# Patient Record
Sex: Female | Born: 1975 | Race: Black or African American | Hispanic: No | Marital: Single | Smoking: Never smoker
Health system: Southern US, Community
[De-identification: ages and names within clinical notes are randomized; demographics above are authoritative.]

---

## 2017-02-17 ENCOUNTER — Emergency Department (HOSPITAL_COMMUNITY): Payer: No Typology Code available for payment source

## 2017-02-17 ENCOUNTER — Encounter (HOSPITAL_COMMUNITY): Payer: Self-pay

## 2017-02-17 ENCOUNTER — Other Ambulatory Visit: Payer: Self-pay

## 2017-02-17 ENCOUNTER — Emergency Department (HOSPITAL_COMMUNITY)
Admission: EM | Admit: 2017-02-17 | Discharge: 2017-02-17 | Disposition: A | Discharge status: Home / Self Care | Payer: No Typology Code available for payment source | Attending: Emergency Medicine | Admitting: Emergency Medicine

## 2017-02-17 DIAGNOSIS — R109 Unspecified abdominal pain: Secondary | ICD-10-CM

## 2017-02-17 DIAGNOSIS — R1010 Upper abdominal pain, unspecified: Secondary | ICD-10-CM | POA: Diagnosis not present

## 2017-02-17 LAB — URINALYSIS, ROUTINE W REFLEX MICROSCOPIC
Bilirubin Urine: NEGATIVE
Glucose, UA: NEGATIVE mg/dL
Ketones, ur: NEGATIVE mg/dL
Leukocytes, UA: NEGATIVE
Nitrite: NEGATIVE
Protein, ur: NEGATIVE mg/dL
Specific Gravity, Urine: >1.046 — ABNORMAL HIGH (ref 1.005–1.030)
pH: 5 (ref 5.0–8.0)

## 2017-02-17 LAB — CBC
HCT: 40.6 % (ref 36.0–46.0)
Hemoglobin: 13.3 g/dL (ref 12.0–15.0)
MCH: 27.4 pg (ref 26.0–34.0)
MCHC: 32.8 g/dL (ref 30.0–36.0)
MCV: 83.7 fL (ref 78.0–100.0)
Platelets: 266 10*3/uL (ref 150–400)
RBC: 4.85 MIL/uL (ref 3.87–5.11)
RDW: 15 % (ref 11.5–15.5)
WBC: 6 10*3/uL (ref 4.0–10.5)

## 2017-02-17 LAB — COMPREHENSIVE METABOLIC PANEL
ALT: 16 U/L (ref 14–54)
AST: 14 U/L — ABNORMAL LOW (ref 15–41)
Albumin: 3.7 g/dL (ref 3.5–5.0)
Alkaline Phosphatase: 52 U/L (ref 38–126)
Anion gap: 6 (ref 5–15)
BUN: 13 mg/dL (ref 6–20)
CO2: 25 mmol/L (ref 22–32)
Calcium: 9.5 mg/dL (ref 8.9–10.3)
Chloride: 107 mmol/L (ref 101–111)
Creatinine, Ser: 0.74 mg/dL (ref 0.44–1.00)
GFR calc Af Amer: >60 mL/min (ref >60)
GFR calc non Af Amer: >60 mL/min (ref >60)
Glucose, Bld: 97 mg/dL (ref 65–99)
Potassium: 4.5 mmol/L (ref 3.5–5.1)
Sodium: 138 mmol/L (ref 135–145)
Total Bilirubin: 0.9 mg/dL (ref 0.3–1.2)
Total Protein: 7.3 g/dL (ref 6.5–8.1)

## 2017-02-17 LAB — I-STAT BETA HCG BLOOD, ED (MC, WL, AP ONLY): I-stat hCG, quantitative: <5 m[IU]/mL (ref <5)

## 2017-02-17 LAB — LIPASE, BLOOD: Lipase: 24 U/L (ref 11–51)

## 2017-02-17 MED ORDER — DICYCLOMINE HCL 10 MG PO CAPS
10.0000 mg | ORAL_CAPSULE | Freq: Once | ORAL | Status: AC
  Administered 2017-02-17: 10 mg via ORAL
  Filled 2017-02-17: qty 1

## 2017-02-17 MED ORDER — SODIUM CHLORIDE 0.9 % IJ SOLN
INTRAMUSCULAR | Status: AC
  Filled 2017-02-17: qty 50

## 2017-02-17 MED ORDER — DICYCLOMINE HCL 20 MG PO TABS: 20.0000 mg | ORAL_TABLET | Freq: Four times a day (QID) | ORAL | 0 refills | Status: DC | PRN

## 2017-02-17 MED ORDER — GI COCKTAIL ~~LOC~~
30.0000 mL | Freq: Once | ORAL | Status: AC
  Administered 2017-02-17: 30 mL via ORAL
  Filled 2017-02-17: qty 30

## 2017-02-17 MED ORDER — SODIUM CHLORIDE 0.9 % IV BOLUS (SEPSIS)
1000.0000 mL | Freq: Once | INTRAVENOUS | Status: AC
  Administered 2017-02-17: 1000 mL via INTRAVENOUS

## 2017-02-17 MED ORDER — PANTOPRAZOLE SODIUM 20 MG PO TBEC: 20.0000 mg | DELAYED_RELEASE_TABLET | Freq: Every day | ORAL | 0 refills | Status: DC

## 2017-02-17 MED ORDER — IOPAMIDOL (ISOVUE-300) INJECTION 61%
INTRAVENOUS | Status: AC
  Administered 2017-02-17: 100 mL via INTRAVENOUS
  Filled 2017-02-17: qty 100

## 2017-02-17 MED ORDER — FAMOTIDINE IN NACL 20-0.9 MG/50ML-% IV SOLN
20.0000 mg | Freq: Once | INTRAVENOUS | Status: AC
  Administered 2017-02-17: 20 mg via INTRAVENOUS
  Filled 2017-02-17: qty 50

## 2017-02-17 NOTE — ED Triage Notes (Signed)
She tells me she has had intermittent upper abd. Discomfort x past several days. She is in no distress.

## 2017-02-17 NOTE — ED Notes (Signed)
Called pt for room placement no response. 

## 2017-02-17 NOTE — ED Notes (Signed)
Urine culture sent down as a save tube if needed.

## 2017-02-17 NOTE — ED Notes (Signed)
Pt informed on urine

## 2017-02-20 NOTE — ED Provider Notes (Signed)
[facility]-EMERGENCY DEPT Provider Note   CSN: [id number] Arrival date & time: 02/17/17  7829     History   Chief Complaint Chief Complaint  Patient presents with  . Abdominal Pain    HPI Cathy Strong is a 41 y.o. female.  HPI   42 year old female with abdominal pain.  Pain is in the mid to upper abdomen.  Does not lateralize.  Ongoing for several days.  She feels like it is getting worse.  No change after eating.  It does not radiate.  Occasional nausea.  No vomiting or diarrhea.  No urinary complaints.  History reviewed. No pertinent past medical history.  There are no active problems to display for this patient.    OB History    No data available       Home Medications    Prior to Admission medications   Medication Sig Start Date End Date Taking? Authorizing Provider  dicyclomine (BENTYL) 20 MG tablet Take 1 tablet (20 mg total) by mouth every 6 (six) hours as needed for spasms. 02/17/17   Raeford Razor, MD  pantoprazole (PROTONIX) 20 MG tablet Take 1 tablet (20 mg total) by mouth daily. 02/17/17   Raeford Razor, MD    Family History No family history on file.  Social History Social History   Tobacco Use  . Smoking status: Never Smoker  . Smokeless tobacco: Never Used  Substance Use Topics  . Alcohol use: No    Frequency: Never  . Drug use: No     Allergies   Patient has no allergy information on record.   Review of Systems Review of Systems  All systems reviewed and negative, other than as noted in HPI.  Physical Exam Updated Vital Signs BP 114/69 (BP Location: Right Arm)   Pulse 78   Temp 98.1 F (36.7 C) (Oral)   Resp 18   LMP 01/29/2017 (Exact Date)   SpO2 100%   Physical Exam  Constitutional: She appears well-developed and well-nourished. No distress.  HENT:  Head: Normocephalic and atraumatic.  Eyes: Conjunctivae are normal. Right eye exhibits no discharge. Left eye exhibits no discharge.  Neck:  Neck supple.  Cardiovascular: Normal rate, regular rhythm and normal heart sounds. Exam reveals no gallop and no friction rub.  No murmur heard. Pulmonary/Chest: Effort normal and breath sounds normal. No respiratory distress.  Abdominal: Soft. She exhibits no distension. There is tenderness.  Periumbilical tenderness without rebound or guarding.  No distention.  Musculoskeletal: She exhibits no edema or tenderness.  Neurological: She is alert.  Skin: Skin is warm and dry.  Psychiatric: She has a normal mood and affect. Her behavior is normal. Thought content normal.  Nursing note and vitals reviewed.    ED Treatments / Results  Labs (all labs ordered are listed, but only abnormal results are displayed) Labs Reviewed  COMPREHENSIVE METABOLIC PANEL - Abnormal; Notable for the following components:      Result Value   AST 14 (*)    All other components within normal limits  URINALYSIS, ROUTINE W REFLEX MICROSCOPIC - Abnormal; Notable for the following components:   Specific Gravity, Urine >1.046 (*)    Hgb urine dipstick SMALL (*)    Bacteria, UA RARE (*)    Squamous Epithelial / LPF 0-5 (*)    All other components within normal limits  LIPASE, BLOOD  CBC  I-STAT BETA HCG BLOOD, ED ([facility], [facility], [facility] ONLY)    EKG  EKG Interpretation None  Radiology No results found.   Ct Abdomen Pelvis W Contrast  Result Date: 02/17/2017 CLINICAL DATA:  Intermittent upper abdominal discomfort. Abdominal pain. EXAM: CT ABDOMEN AND PELVIS WITH CONTRAST TECHNIQUE: Multidetector CT imaging of the abdomen and pelvis was performed using the standard protocol following bolus administration of intravenous contrast. CONTRAST:  <See Chart> ISOVUE-300 IOPAMIDOL (ISOVUE-300) INJECTION 61% COMPARISON:  None. FINDINGS: Lower chest: No acute abnormality. Hepatobiliary: No focal liver abnormality is seen. No gallstones, gallbladder wall thickening, or biliary dilatation. Pancreas: Unremarkable. No  pancreatic ductal dilatation or surrounding inflammatory changes. Spleen: Normal in size without focal abnormality. Adrenals/Urinary Tract: Normal appearance of the adrenal glands. No kidney mass or hydronephrosis. The urinary bladder appears normal. Stomach/Bowel: The stomach is normal. Normal appearance of the small bowel loops. The appendix is visualized and appears normal. No pathologic dilatation of the colon. No abnormal bowel wall thickening or inflammation identified. Vascular/Lymphatic: Normal appearance of the abdominal aorta. No enlarged retroperitoneal or mesenteric adenopathy. No enlarged pelvic or inguinal lymph nodes. Reproductive: Uterus and bilateral adnexa are unremarkable. Other: Trace free fluid identified within the posterior pelvis. Musculoskeletal: No acute or significant osseous findings. IMPRESSION: 1. No acute findings within the abdomen or pelvis. 2. Trace free fluid within the pelvis which may be a physiologic finding in a premenopausal female Electronically Signed   By: Signa Kellaylor  Stroud M.D.   On: 02/17/2017 13:46    Procedures Procedures (including critical care time)  Medications Ordered in ED Medications  sodium chloride 0.9 % bolus 1,000 mL (0 mLs Intravenous Stopped 02/17/17 1549)  gi cocktail (Maalox,Lidocaine,Donnatal) (30 mLs Oral Given 02/17/17 1400)  dicyclomine (BENTYL) capsule 10 mg (10 mg Oral Given 02/17/17 1400)  famotidine (PEPCID) IVPB 20 mg premix (0 mg Intravenous Stopped 02/17/17 1430)  iopamidol (ISOVUE-300) 61 % injection (100 mLs Intravenous Contrast Given 02/17/17 1329)     Initial Impression / Assessment and Plan / ED Course  I have reviewed the triage vital signs and the nursing notes.  Pertinent labs & imaging results that were available during my care of the patient were reviewed by me and considered in my medical decision making (see chart for details).     41yF with abd pain. Doubt emergent process. ED w/u fairly unremarkable.   Final  Clinical Impressions(s) / ED Diagnoses   Final diagnoses:  Abdominal pain, unspecified abdominal location    ED Discharge Orders        Ordered    pantoprazole (PROTONIX) 20 MG tablet  Daily     02/17/17 1535    dicyclomine (BENTYL) 20 MG tablet  Every 6 hours PRN     02/17/17 1535       Raeford RazorKohut, Zaila Crew, MD 02/20/17 1741

## 2017-09-13 ENCOUNTER — Emergency Department (HOSPITAL_COMMUNITY)
Admission: EM | Admit: 2017-09-13 | Discharge: 2017-09-13 | Disposition: A | Discharge status: Home / Self Care | Payer: No Typology Code available for payment source | Attending: Emergency Medicine | Admitting: Emergency Medicine

## 2017-09-13 ENCOUNTER — Other Ambulatory Visit: Payer: Self-pay

## 2017-09-13 ENCOUNTER — Emergency Department (HOSPITAL_COMMUNITY): Payer: No Typology Code available for payment source

## 2017-09-13 DIAGNOSIS — S43401A Unspecified sprain of right shoulder joint, initial encounter: Secondary | ICD-10-CM | POA: Insufficient documentation

## 2017-09-13 DIAGNOSIS — S63601A Unspecified sprain of right thumb, initial encounter: Secondary | ICD-10-CM | POA: Diagnosis not present

## 2017-09-13 DIAGNOSIS — S4991XA Unspecified injury of right shoulder and upper arm, initial encounter: Secondary | ICD-10-CM | POA: Diagnosis present

## 2017-09-13 DIAGNOSIS — Y939 Activity, unspecified: Secondary | ICD-10-CM | POA: Diagnosis not present

## 2017-09-13 DIAGNOSIS — Y999 Unspecified external cause status: Secondary | ICD-10-CM | POA: Insufficient documentation

## 2017-09-13 DIAGNOSIS — Y929 Unspecified place or not applicable: Secondary | ICD-10-CM | POA: Insufficient documentation

## 2017-09-13 MED ORDER — LIDOCAINE 5 % EX PTCH: 1.0000 | MEDICATED_PATCH | CUTANEOUS | 0 refills | Status: AC

## 2017-09-13 MED ORDER — METHOCARBAMOL 500 MG PO TABS: 500.0000 mg | ORAL_TABLET | Freq: Two times a day (BID) | ORAL | 0 refills | Status: AC

## 2017-09-13 MED ORDER — DICLOFENAC SODIUM 75 MG PO TBEC: 75.0000 mg | DELAYED_RELEASE_TABLET | Freq: Two times a day (BID) | ORAL | 0 refills | Status: AC
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
## 2017-09-13 NOTE — Discharge Instructions (Signed)
See your Physician for recheck next week  °

## 2018-12-25 IMAGING — CT CT ABD-PELV W/ CM
2 of 5 series · 16 of 46 positions shown, 18 images · IV contrast (ISOVUE)
Comparison: None.

CLINICAL DATA: Intermittent upper abdominal discomfort. Abdominal
pain.

EXAM:
CT ABDOMEN AND PELVIS WITH CONTRAST
TECHNIQUE: Multidetector CT imaging of the abdomen and pelvis was performed
using the standard protocol following bolus administration of
intravenous contrast.
CONTRAST:  <See Chart> 3FPRQ0-XKK IOPAMIDOL (3FPRQ0-XKK) INJECTION
61%

[Series 2: axial st · axial · 0.86mm/px · z∈[+1057,+1487]mm · 13 of 100 slices shown, 15 images]
[im 7/100  soft-tissue]
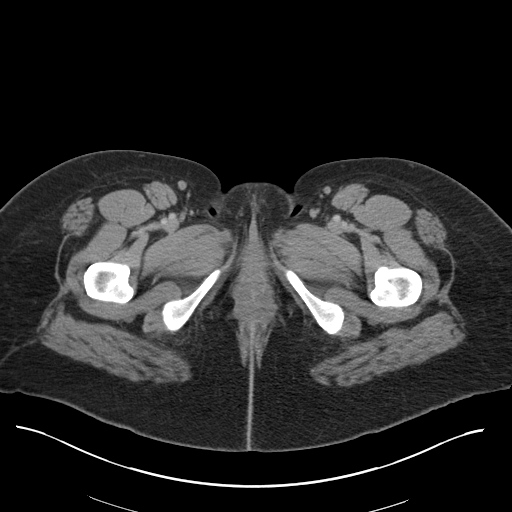
[im 7/100  bone]
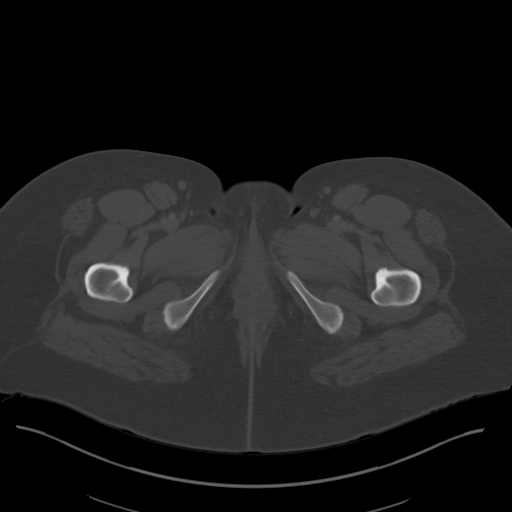
[im 13/100  soft-tissue]
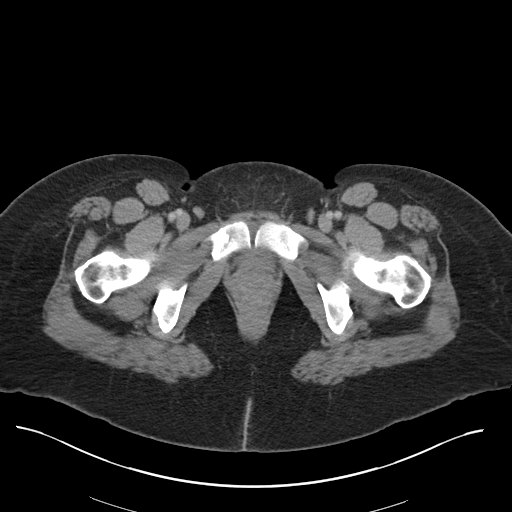
[im 19/100  soft-tissue]
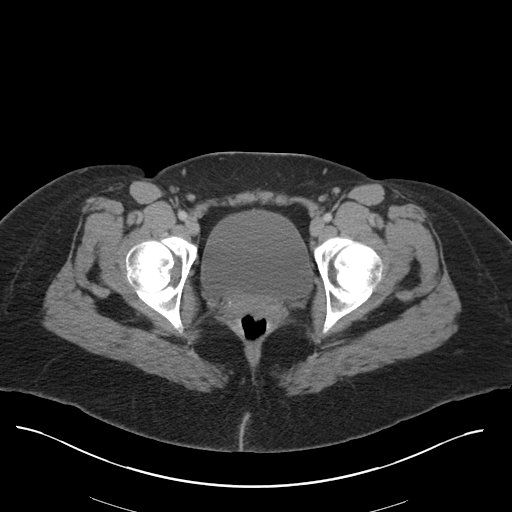
[im 31/100  soft-tissue]
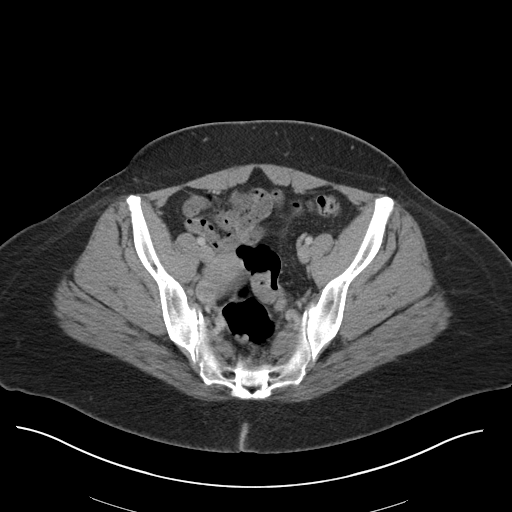
[im 38/100  soft-tissue]
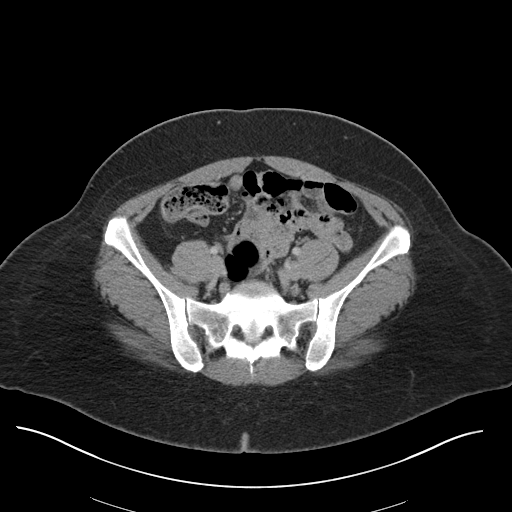
[im 44/100  soft-tissue]
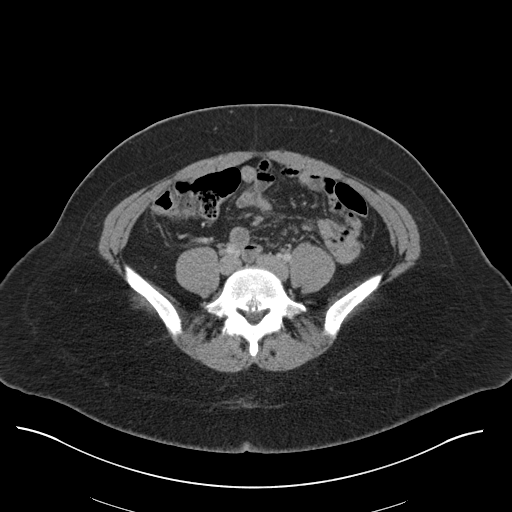
[im 50/100  soft-tissue]
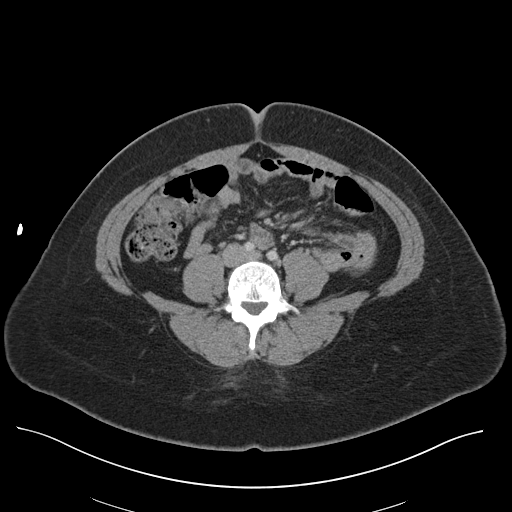
[im 56/100  soft-tissue]
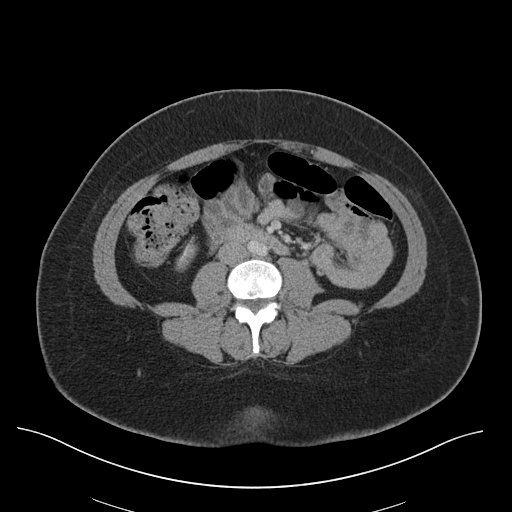
[im 62/100  soft-tissue]
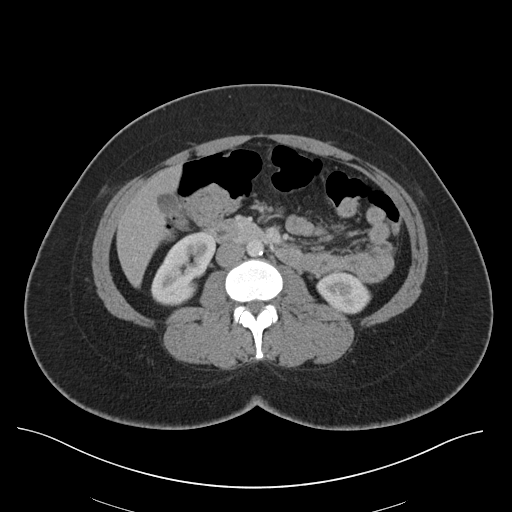
[im 62/100  bone]
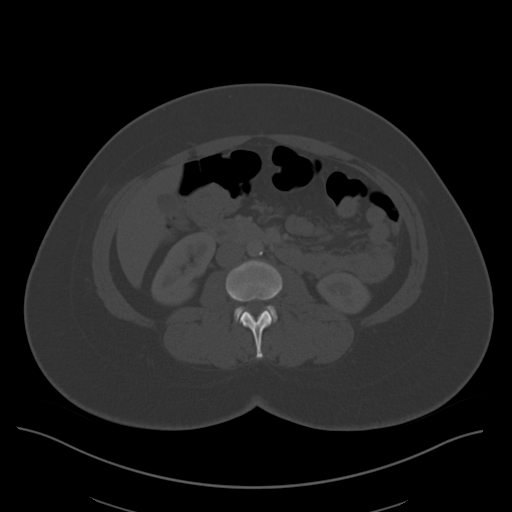
[im 69/100  soft-tissue]
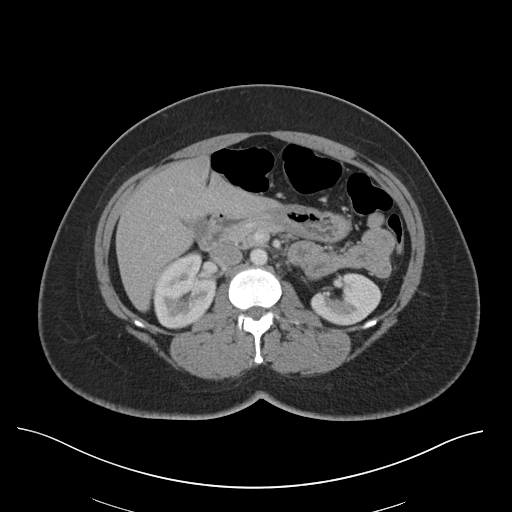
[im 81/100  soft-tissue]
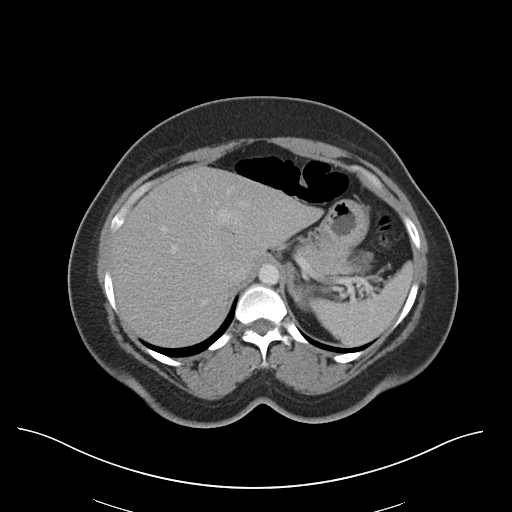
[im 87/100  soft-tissue]
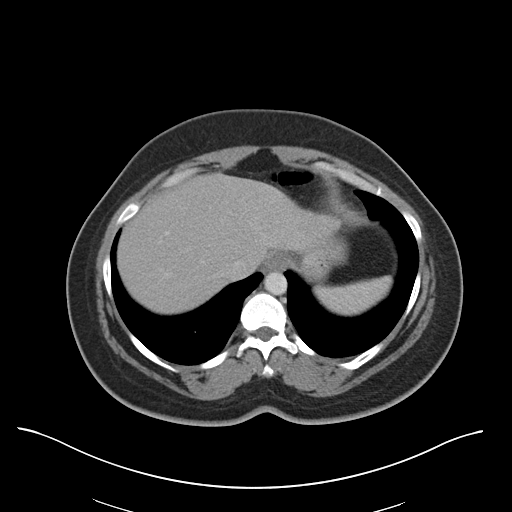
[im 93/100  soft-tissue]
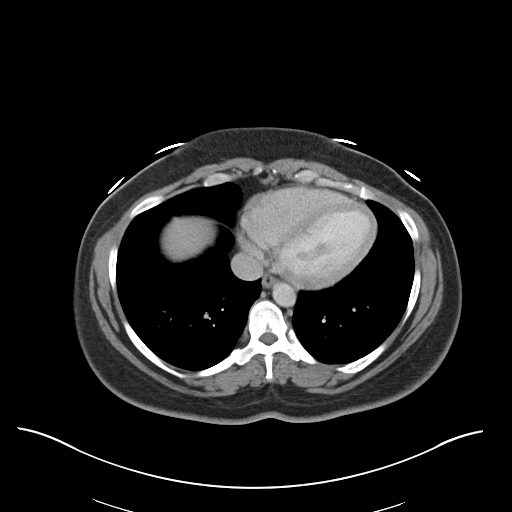

[Series 5: coronal st · coronal · 0.72mm/px · 3 of 94 slices shown]
[im 32/94  soft-tissue]
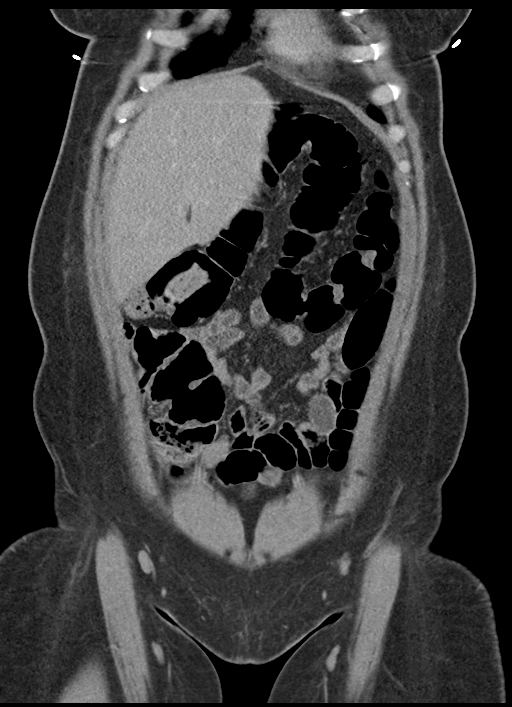
[im 42/94  soft-tissue]
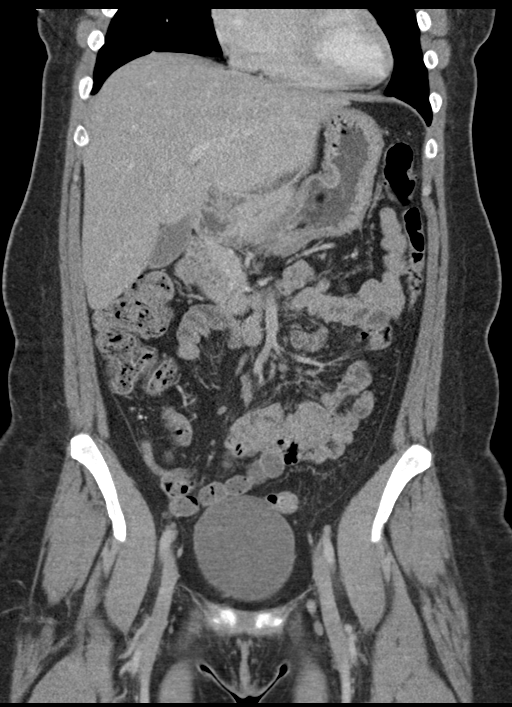
[im 52/94  soft-tissue]
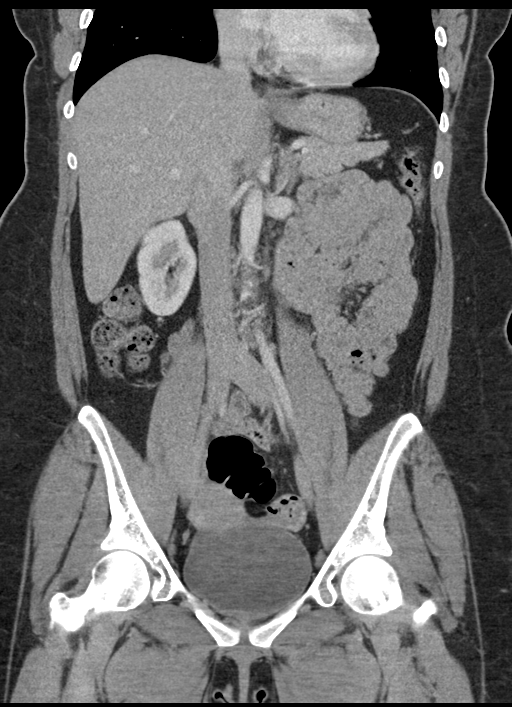

[16 of 46 positions shown; findings below may reference images not displayed]

FINDINGS: Lower chest: No acute abnormality.

Hepatobiliary: No focal liver abnormality is seen. No gallstones,
gallbladder wall thickening, or biliary dilatation.

Pancreas: Unremarkable. No pancreatic ductal dilatation or
surrounding inflammatory changes.

Spleen: Normal in size without focal abnormality.

Adrenals/Urinary Tract: Normal appearance of the adrenal glands. No
kidney mass or hydronephrosis. The urinary bladder appears normal.

Stomach/Bowel: The stomach is normal. Normal appearance of the small
bowel loops. The appendix is visualized and appears normal. No
pathologic dilatation of the colon. No abnormal bowel wall
thickening or inflammation identified.

Vascular/Lymphatic: Normal appearance of the abdominal aorta. No
enlarged retroperitoneal or mesenteric adenopathy. No enlarged
pelvic or inguinal lymph nodes.

Reproductive: Uterus and bilateral adnexa are unremarkable.

Other: Trace free fluid identified within the posterior pelvis.

Musculoskeletal: No acute or significant osseous findings.
IMPRESSION: 1. No acute findings within the abdomen or pelvis.
2. Trace free fluid within the pelvis which may be a physiologic
finding in a premenopausal female
